# Patient Record
Sex: Male | Born: 1995 | Race: Black or African American | Hispanic: No | Marital: Single | Smoking: Never smoker
Health system: Southern US, Community
[De-identification: ages and names within clinical notes are randomized; demographics above are authoritative.]

---
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
## 2017-01-09 ENCOUNTER — Encounter (HOSPITAL_COMMUNITY): Payer: Self-pay | Admitting: *Deleted

## 2017-01-09 ENCOUNTER — Emergency Department (HOSPITAL_COMMUNITY)
Admission: EM | Admit: 2017-01-09 | Discharge: 2017-01-09 | Disposition: A | Discharge status: Home / Self Care | Payer: Medicaid Other | Attending: Emergency Medicine | Admitting: Emergency Medicine

## 2017-01-09 DIAGNOSIS — Z5321 Procedure and treatment not carried out due to patient leaving prior to being seen by health care provider: Secondary | ICD-10-CM | POA: Diagnosis not present

## 2017-01-09 DIAGNOSIS — J029 Acute pharyngitis, unspecified: Secondary | ICD-10-CM | POA: Diagnosis present

## 2017-01-09 DIAGNOSIS — M791 Myalgia, unspecified site: Secondary | ICD-10-CM | POA: Diagnosis not present

## 2017-01-09 LAB — RAPID STREP SCREEN (MED CTR MEBANE ONLY): Streptococcus, Group A Screen (Direct): NEGATIVE

## 2017-01-09 NOTE — ED Triage Notes (Signed)
Pt reports having sore throat and bodyaches since last night. Denies fever. Airway intact at triage.

## 2017-01-09 NOTE — ED Notes (Signed)
No answer in waiting room and no response from [employer] [facility].

## 2017-01-09 NOTE — ED Notes (Signed)
No answer in waiting area.

## 2017-01-09 NOTE — ED Notes (Signed)
No answer for treatment room.  [employer] message sent to patient.

## 2017-01-12 LAB — CULTURE, GROUP A STREP (THRC)
